# Patient Record
Sex: Female | Born: 1985 | Race: Black or African American | Hispanic: No | Marital: Single | Smoking: Current every day smoker
Health system: Southern US, Community
[De-identification: ages and names within clinical notes are randomized; demographics above are authoritative.]

## PROBLEM LIST (undated history)

## (undated) HISTORY — PX: TUBAL LIGATION: SHX77

---

## 2007-05-13 ENCOUNTER — Ambulatory Visit: Payer: Self-pay | Admitting: Obstetrics and Gynecology

## 2007-05-13 ENCOUNTER — Inpatient Hospital Stay (HOSPITAL_COMMUNITY): Admission: AD | Admit: 2007-05-13 | Discharge: 2007-05-13 | Payer: Self-pay | Admitting: Obstetrics and Gynecology

## 2007-06-06 ENCOUNTER — Ambulatory Visit: Payer: Self-pay | Admitting: Obstetrics & Gynecology

## 2007-06-13 ENCOUNTER — Ambulatory Visit: Payer: Self-pay | Admitting: Obstetrics & Gynecology

## 2007-06-15 ENCOUNTER — Inpatient Hospital Stay (HOSPITAL_COMMUNITY): Admission: AD | Admit: 2007-06-15 | Discharge: 2007-06-15 | Payer: Self-pay | Admitting: Obstetrics and Gynecology

## 2007-06-15 ENCOUNTER — Ambulatory Visit: Payer: Self-pay | Admitting: Physician Assistant

## 2007-06-20 ENCOUNTER — Ambulatory Visit: Payer: Self-pay | Admitting: Obstetrics & Gynecology

## 2007-06-20 ENCOUNTER — Ambulatory Visit: Payer: Self-pay | Admitting: Gynecology

## 2007-06-20 ENCOUNTER — Inpatient Hospital Stay (HOSPITAL_COMMUNITY): Admission: RE | Admit: 2007-06-20 | Discharge: 2007-06-24 | Payer: Self-pay | Admitting: Obstetrics & Gynecology

## 2007-06-21 ENCOUNTER — Other Ambulatory Visit: Payer: Self-pay | Admitting: Obstetrics & Gynecology

## 2008-03-09 IMAGING — US US OB COMP +14 WK
1 series · 14 of 28 positions shown · non-contrast
Comparison: none

OBSTETRICAL ULTRASOUND:

 This ultrasound exam was performed in the [HOSPITAL] Ultrasound Department.  The OB US report was generated in the AS system, and faxed to the ordering physician.  This report is also available in [REDACTED] PACS.

[Series 1: us ob comp +14 wk · 14 of 31 slices shown]
[im 2/31]
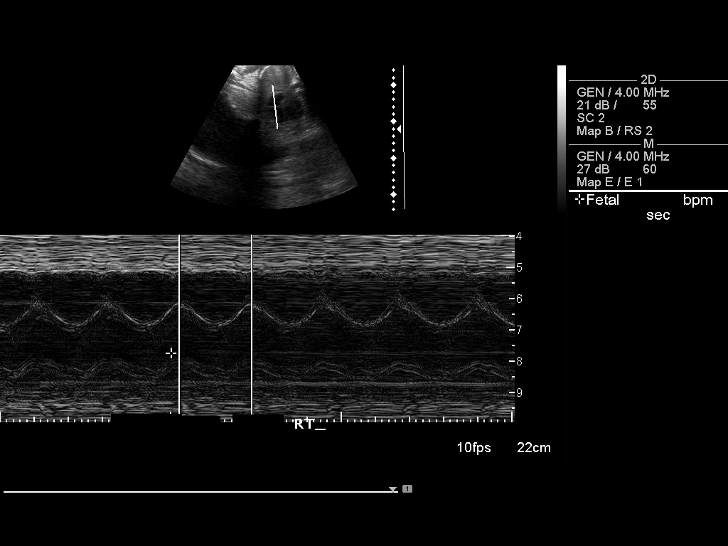
[im 4/31]
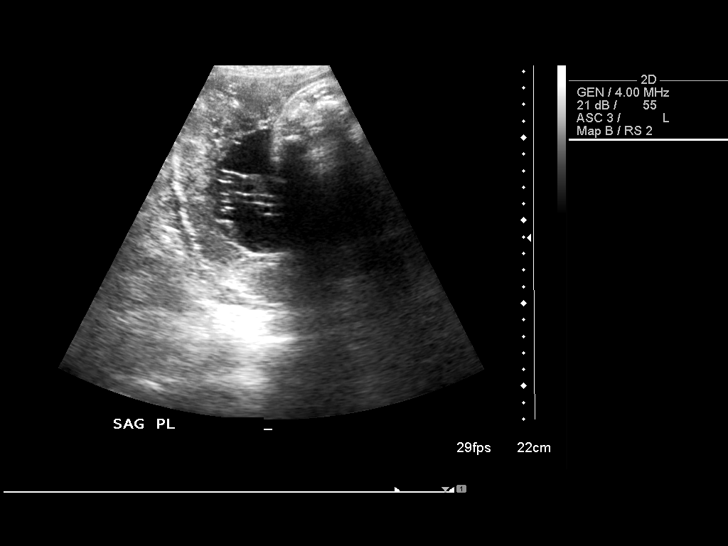
[im 6/31]
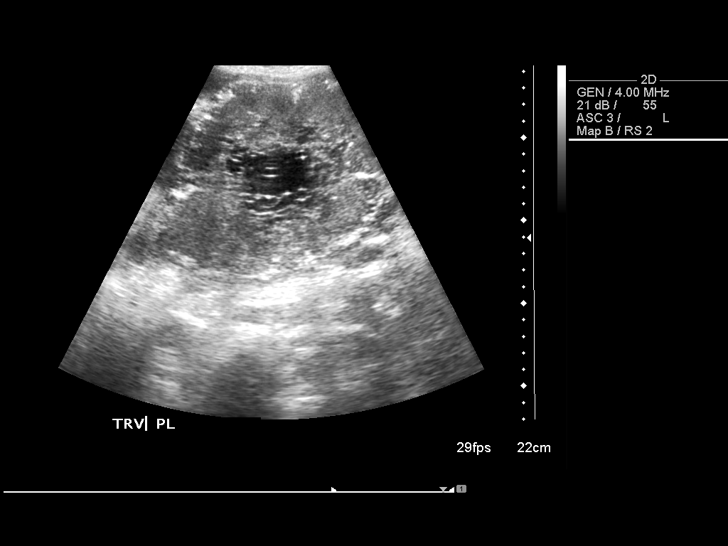
[im 8/31]
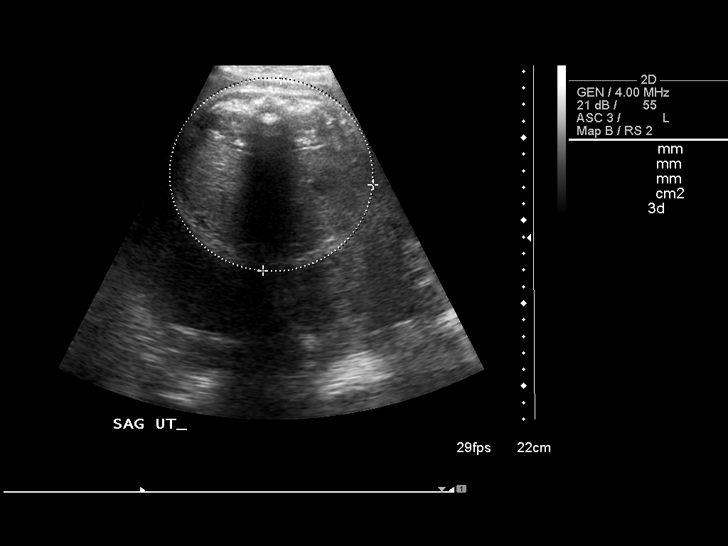
[im 11/31]
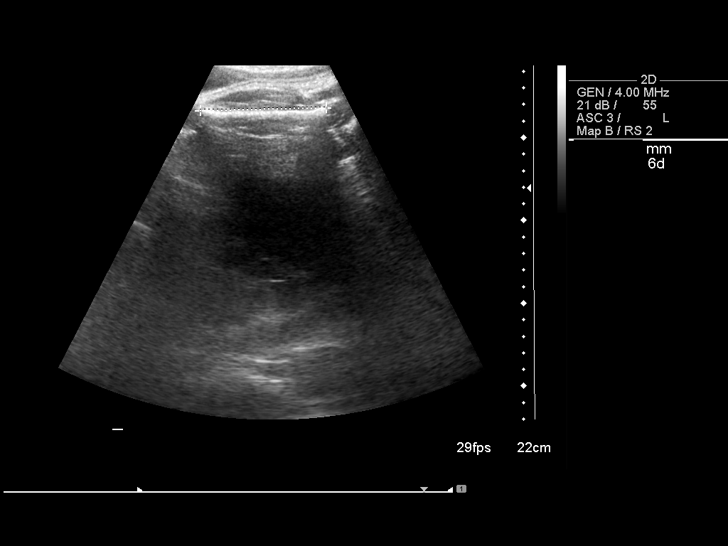
[im 13/31]
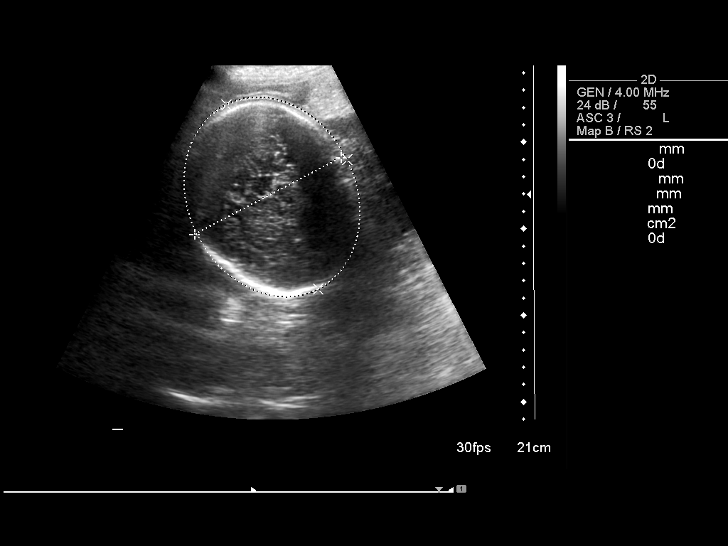
[im 15/31]
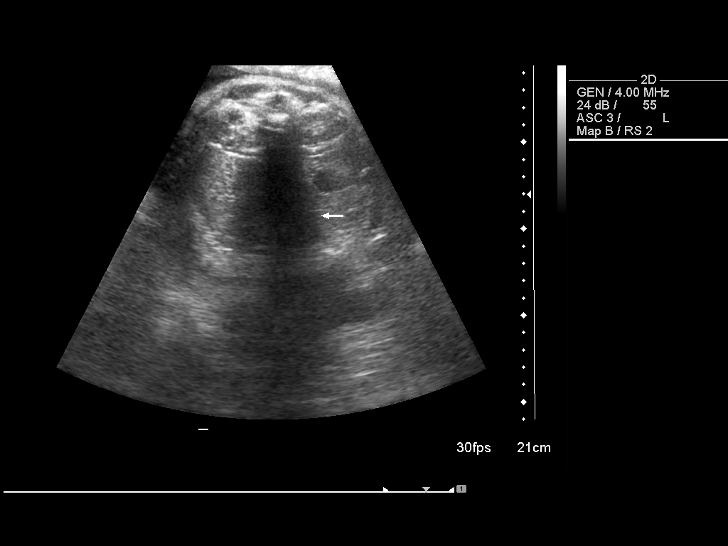
[im 17/31]
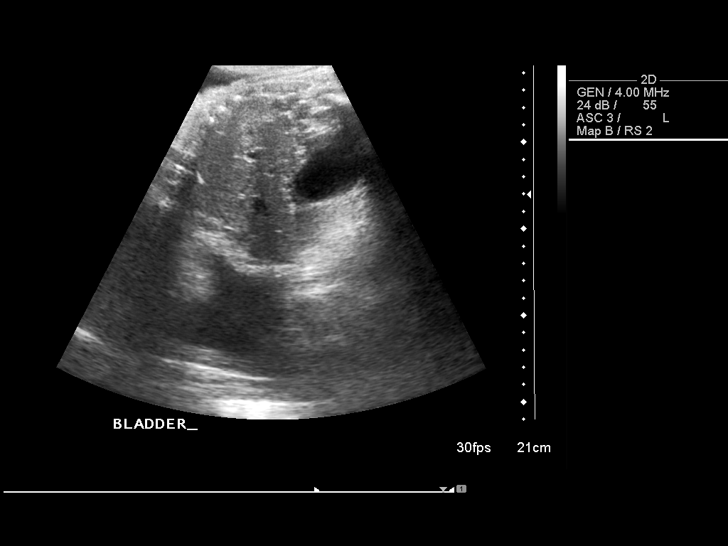
[im 19/31]
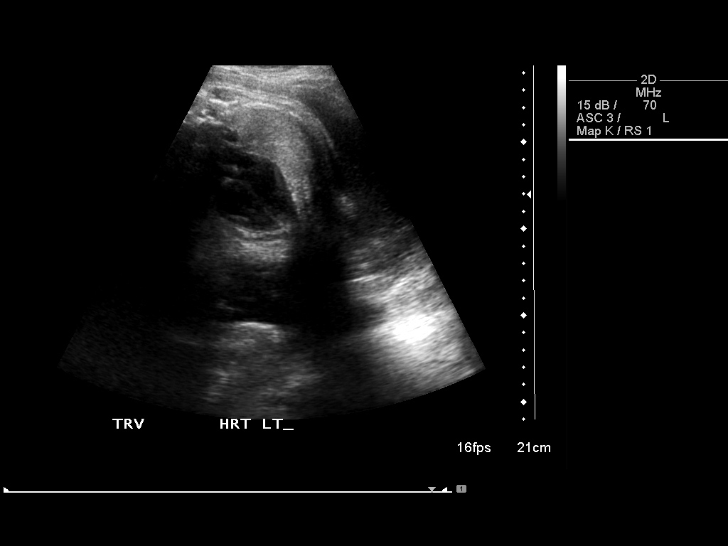
[im 22/31]
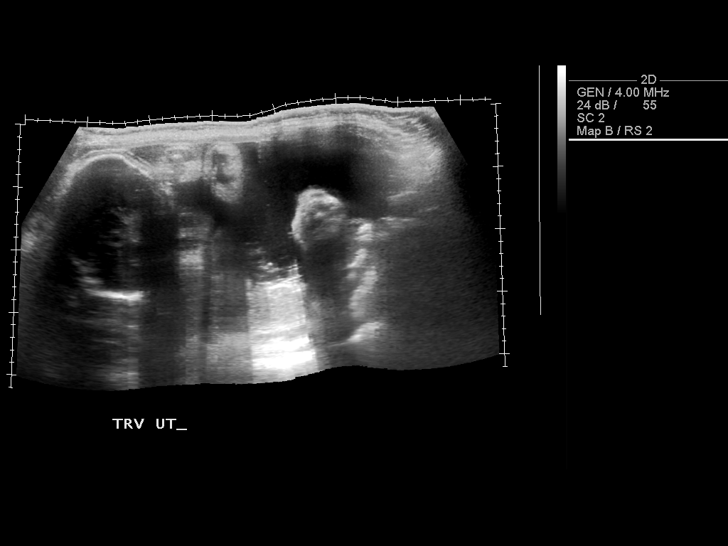
[im 24/31]
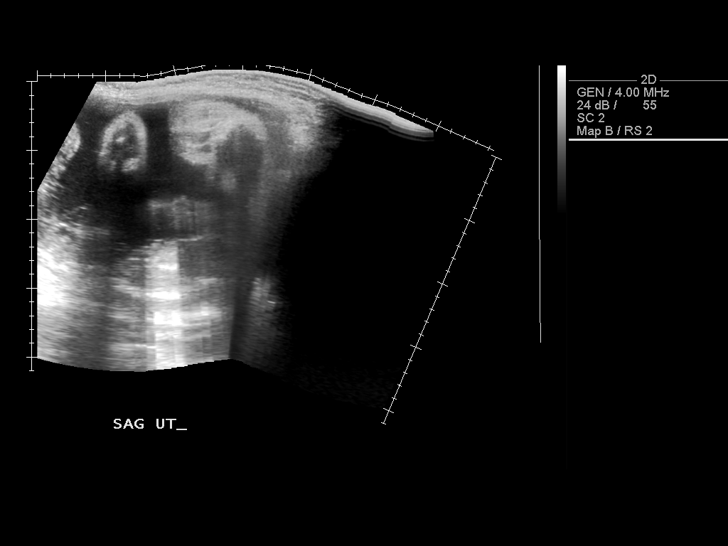
[im 26/31]
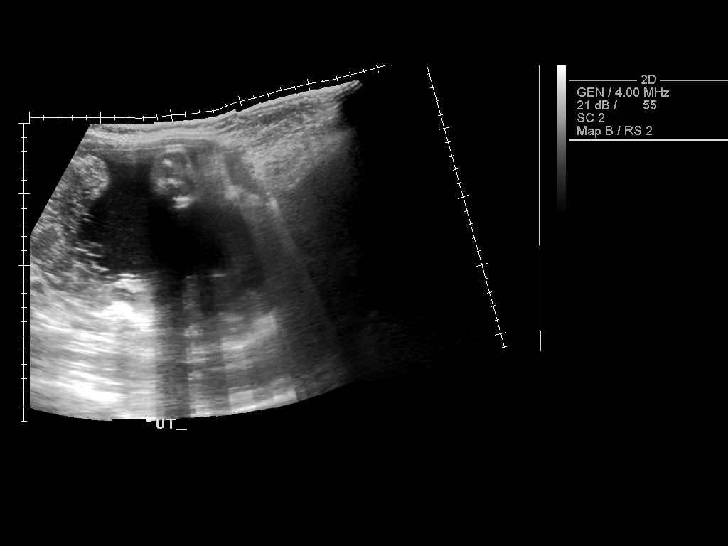
[im 28/31]
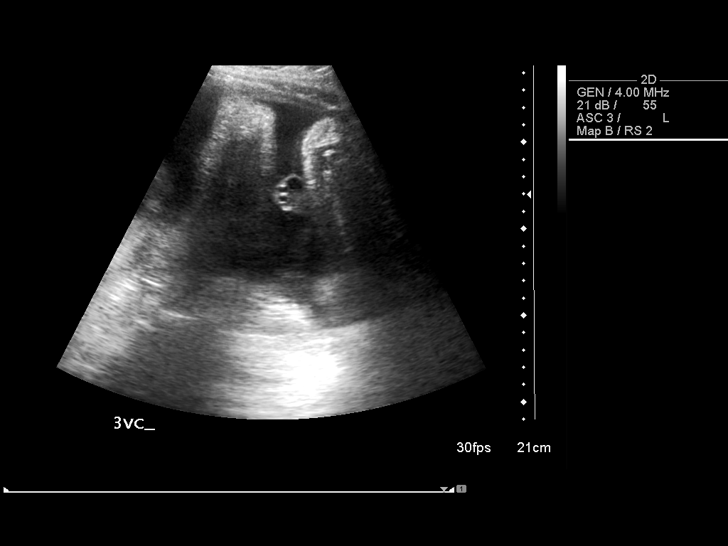
[im 31/31]
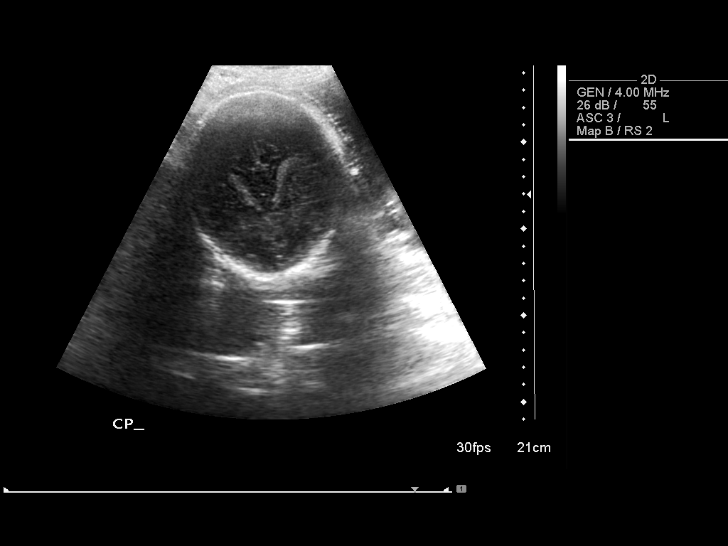

[14 of 28 positions shown; findings below may reference images not displayed]

IMPRESSION: See [facility] Obstetric US report.

## 2008-06-02 ENCOUNTER — Inpatient Hospital Stay (HOSPITAL_COMMUNITY): Admission: AD | Admit: 2008-06-02 | Discharge: 2008-06-02 | Payer: Self-pay | Admitting: Obstetrics

## 2008-06-09 ENCOUNTER — Inpatient Hospital Stay (HOSPITAL_COMMUNITY): Admission: AD | Admit: 2008-06-09 | Discharge: 2008-06-09 | Payer: Self-pay | Admitting: Obstetrics

## 2008-06-27 ENCOUNTER — Inpatient Hospital Stay (HOSPITAL_COMMUNITY): Admission: AD | Admit: 2008-06-27 | Discharge: 2008-06-27 | Payer: Self-pay | Admitting: Obstetrics

## 2008-07-05 ENCOUNTER — Inpatient Hospital Stay (HOSPITAL_COMMUNITY): Admission: AD | Admit: 2008-07-05 | Discharge: 2008-07-05 | Payer: Self-pay | Admitting: Obstetrics

## 2008-07-28 ENCOUNTER — Inpatient Hospital Stay (HOSPITAL_COMMUNITY): Admission: AD | Admit: 2008-07-28 | Discharge: 2008-07-28 | Payer: Self-pay | Admitting: Obstetrics

## 2008-08-08 ENCOUNTER — Inpatient Hospital Stay (HOSPITAL_COMMUNITY): Admission: AD | Admit: 2008-08-08 | Discharge: 2008-08-08 | Payer: Self-pay | Admitting: Obstetrics

## 2008-08-10 ENCOUNTER — Inpatient Hospital Stay (HOSPITAL_COMMUNITY): Admission: AD | Admit: 2008-08-10 | Discharge: 2008-08-10 | Payer: Self-pay | Admitting: Obstetrics

## 2008-08-12 ENCOUNTER — Inpatient Hospital Stay (HOSPITAL_COMMUNITY): Admission: AD | Admit: 2008-08-12 | Discharge: 2008-08-14 | Payer: Self-pay | Admitting: Obstetrics

## 2009-03-04 ENCOUNTER — Emergency Department (HOSPITAL_COMMUNITY): Admission: EM | Admit: 2009-03-04 | Discharge: 2009-03-04 | Payer: Self-pay | Admitting: Emergency Medicine

## 2009-03-22 ENCOUNTER — Ambulatory Visit: Payer: Self-pay | Admitting: Physician Assistant

## 2009-03-22 ENCOUNTER — Inpatient Hospital Stay (HOSPITAL_COMMUNITY): Admission: AD | Admit: 2009-03-22 | Discharge: 2009-03-22 | Payer: Self-pay | Admitting: Obstetrics and Gynecology

## 2009-04-30 IMAGING — US US FETAL BPP W/O NONSTRESS
1 series · 14 of 14 positions shown · non-contrast
Comparison: none

OBSTETRICAL ULTRASOUND:
 This ultrasound exam was performed in the [HOSPITAL] Ultrasound Department.  The OB US report was generated in the AS system, and faxed to the ordering physician.  This report is also available in [REDACTED] PACS.

[Series 1: us fetal bpp w/o nonstress · non-contrast · 14 acquisitions, 14 frames shown]
[im 1/14]
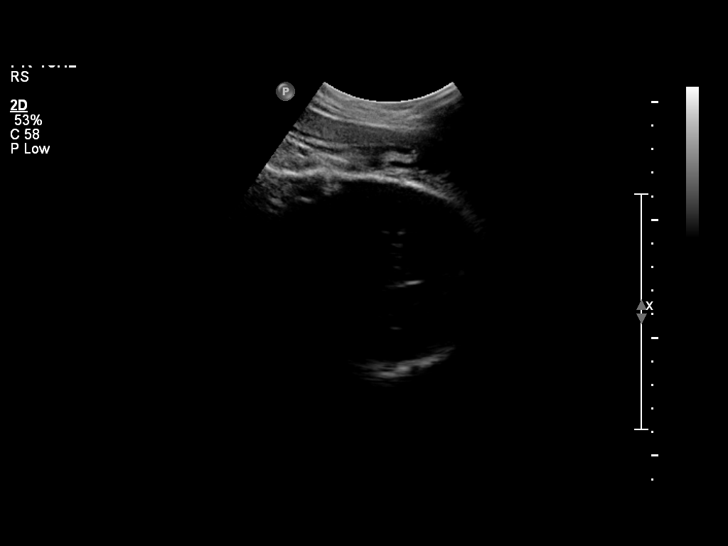
[im 2/14]
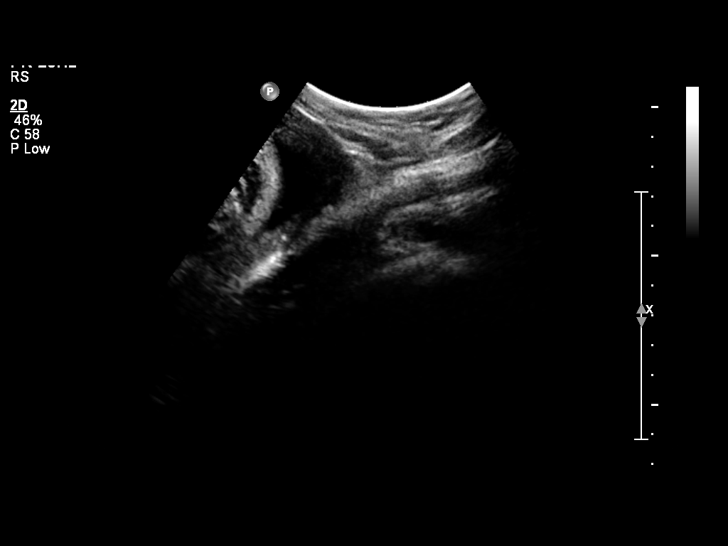
[im 3/14]
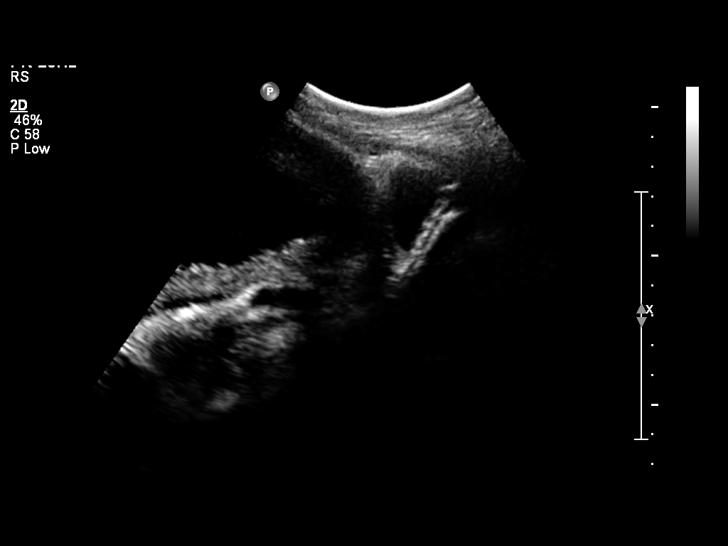
[im 4/14]
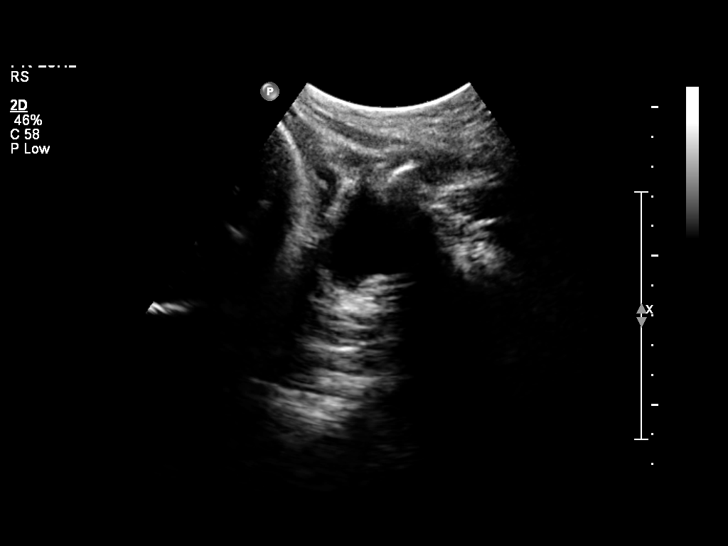
[im 5/14]
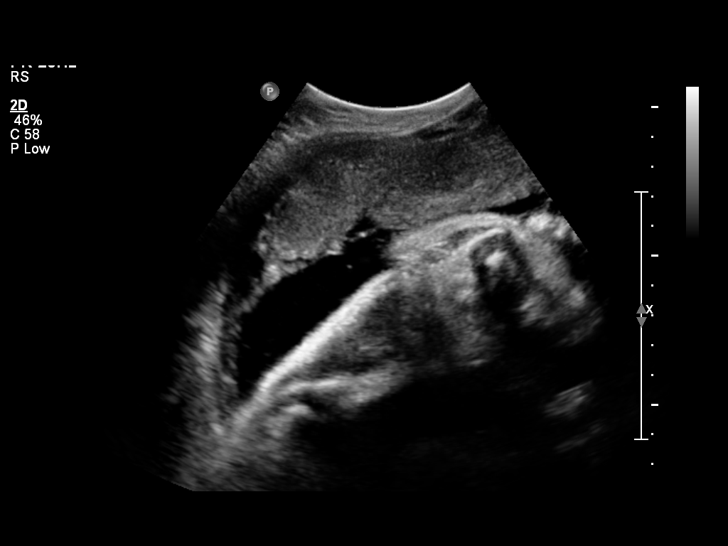
[im 6/14]
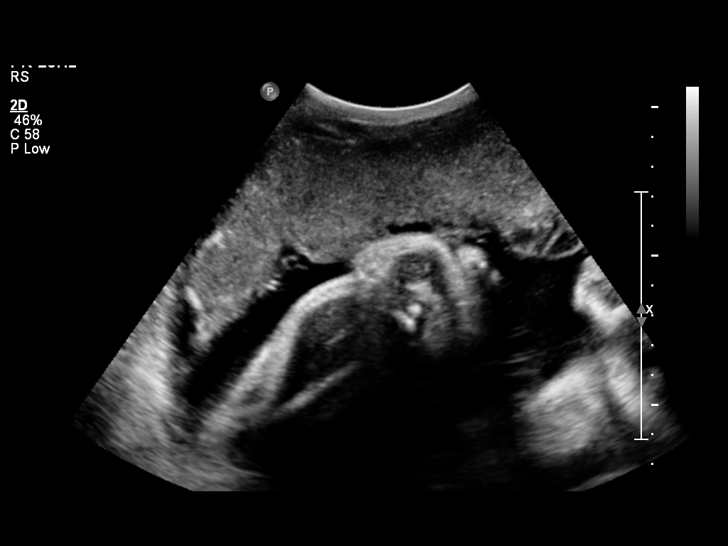
[im 7/14]
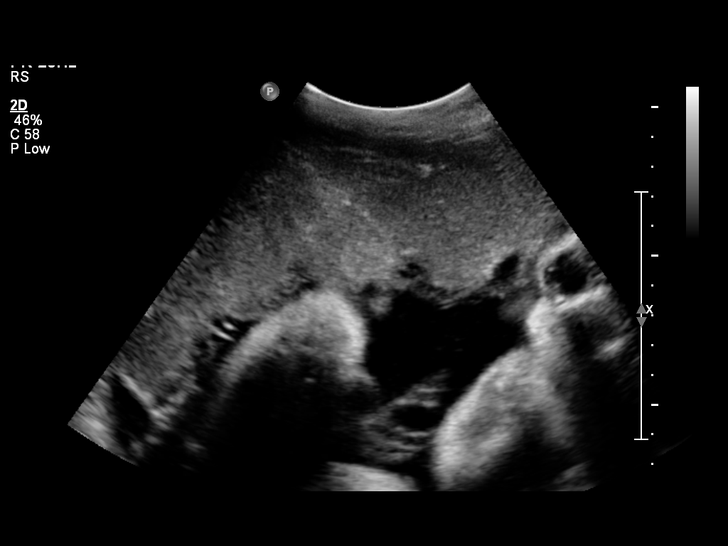
[im 8/14]
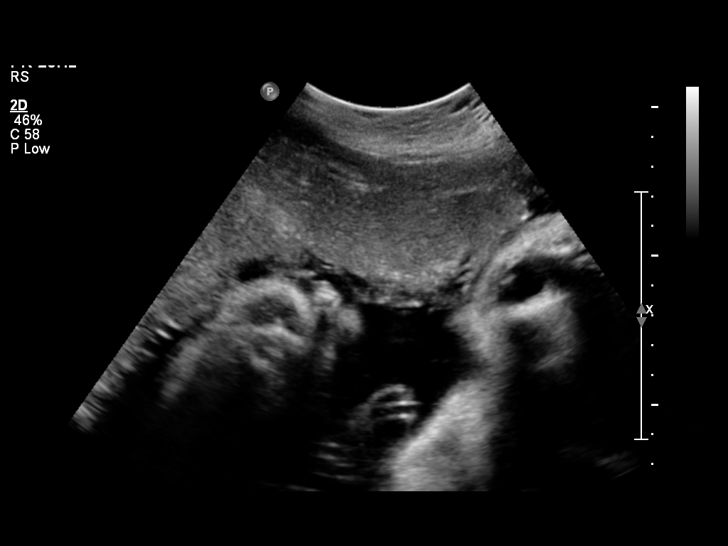
[im 9/14]
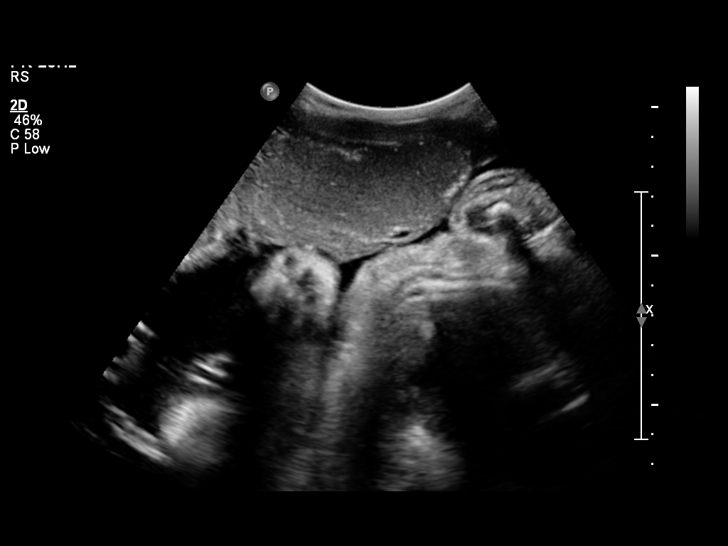
[im 10/14]
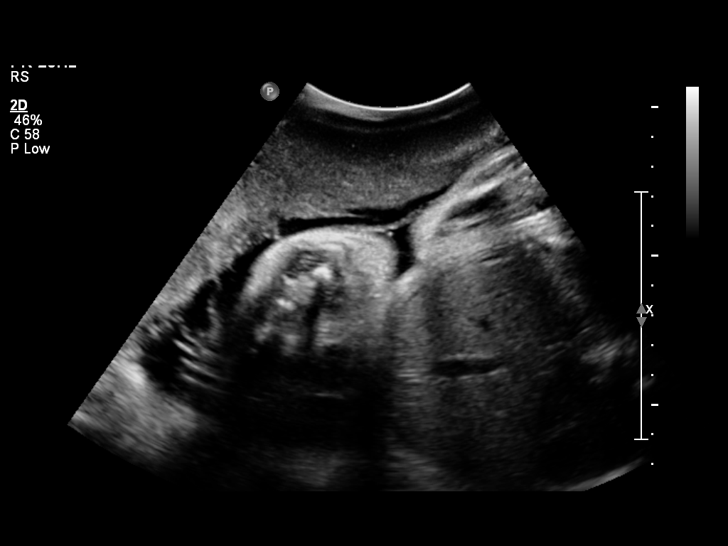
[im 11/14]
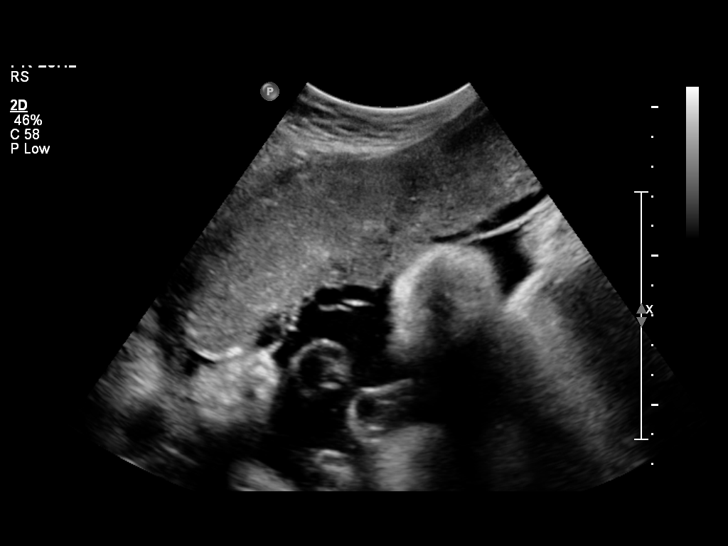
[im 12/14]
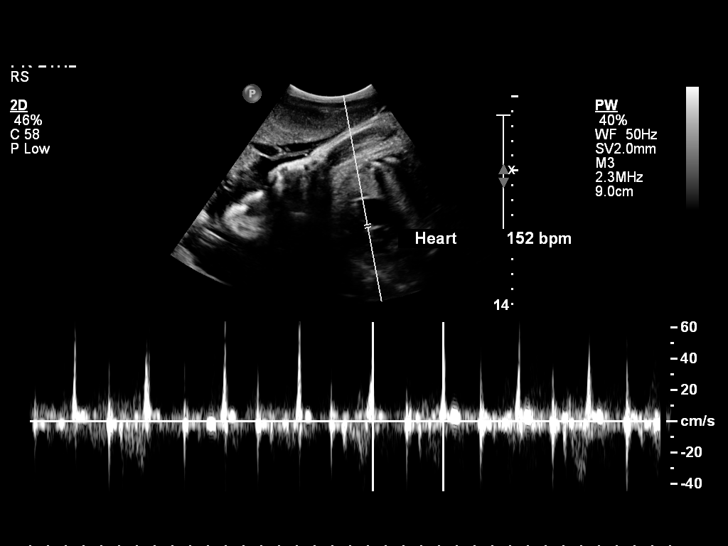
[im 13/14]
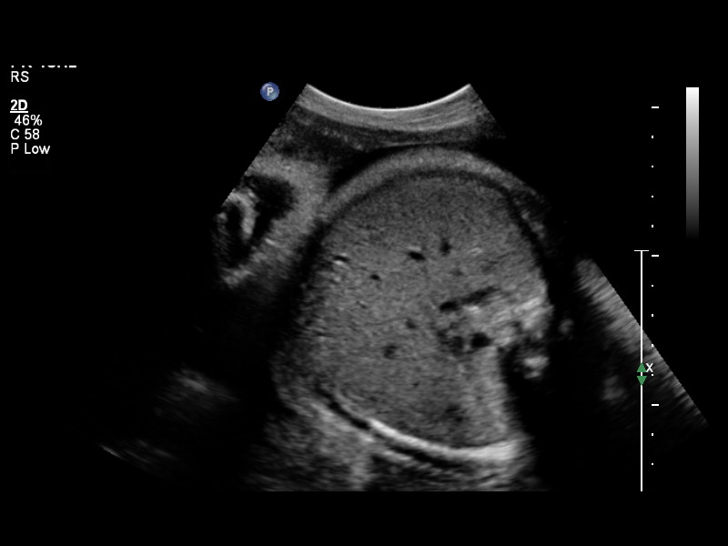
[im 14/14]
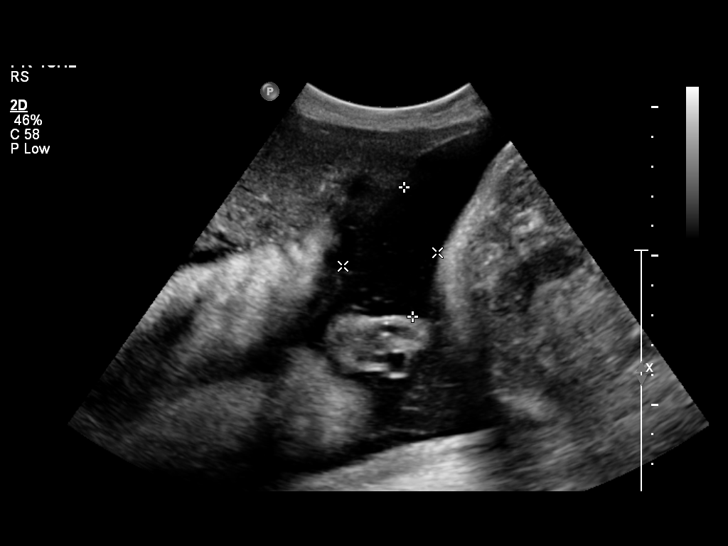

[14 of 14 positions shown; findings below may reference images not displayed]

IMPRESSION: See AS Obstetric US report.

## 2010-12-05 ENCOUNTER — Encounter: Payer: Self-pay | Admitting: *Deleted

## 2011-02-23 LAB — URINALYSIS, ROUTINE W REFLEX MICROSCOPIC
Bilirubin Urine: NEGATIVE
Glucose, UA: NEGATIVE mg/dL
Ketones, ur: NEGATIVE mg/dL
pH: 5.5 (ref 5.0–8.0)

## 2011-02-23 LAB — GC/CHLAMYDIA PROBE AMP, GENITAL
Chlamydia, DNA Probe: NEGATIVE
GC Probe Amp, Genital: NEGATIVE

## 2011-02-23 LAB — URINE MICROSCOPIC-ADD ON

## 2011-02-23 LAB — WET PREP, GENITAL

## 2011-08-12 LAB — URINALYSIS, ROUTINE W REFLEX MICROSCOPIC
Bilirubin Urine: NEGATIVE
Hgb urine dipstick: NEGATIVE
Ketones, ur: 15 — AB
Nitrite: NEGATIVE
Nitrite: NEGATIVE
Protein, ur: NEGATIVE
Protein, ur: NEGATIVE
Specific Gravity, Urine: 1.025
Specific Gravity, Urine: 1.02
Urobilinogen, UA: 1
Urobilinogen, UA: 0.2
pH: 6

## 2011-08-12 LAB — WET PREP, GENITAL
Clue Cells Wet Prep HPF POC: NONE SEEN
Clue Cells Wet Prep HPF POC: NONE SEEN
Trich, Wet Prep: NONE SEEN
Trich, Wet Prep: NONE SEEN
Yeast Wet Prep HPF POC: NONE SEEN

## 2011-08-12 LAB — GC/CHLAMYDIA PROBE AMP, GENITAL: GC Probe Amp, Genital: POSITIVE — AB

## 2011-08-15 LAB — CBC
HCT: 27.1 — ABNORMAL LOW
Hemoglobin: 9 — ABNORMAL LOW
Platelets: 250
Platelets: 184
RBC: 4.11
RDW: 14.8
WBC: 12.6 — ABNORMAL HIGH
WBC: 12.9 — ABNORMAL HIGH

## 2011-08-15 LAB — RPR: RPR Ser Ql: NONREACTIVE

## 2011-08-29 LAB — POCT URINALYSIS DIP (DEVICE)
Bilirubin Urine: NEGATIVE
Glucose, UA: NEGATIVE
Glucose, UA: NEGATIVE
Glucose, UA: NEGATIVE
Ketones, ur: NEGATIVE
Nitrite: NEGATIVE
Nitrite: NEGATIVE
Nitrite: NEGATIVE
Operator id: 134861
Operator id: 14902
Urobilinogen, UA: 1
Urobilinogen, UA: 1
pH: 6

## 2011-08-29 LAB — FLM(FETAL LUNG MATURITY), AMNIOTIC FLUID: Gestational Age: 37

## 2011-08-29 LAB — CBC
HCT: 32.6 — ABNORMAL LOW
Hemoglobin: 10.9 — ABNORMAL LOW
Hemoglobin: 9.9 — ABNORMAL LOW
MCHC: 33.3
MCHC: 33.4
Platelets: 230
Platelets: 266
RDW: 15.8 — ABNORMAL HIGH
RDW: 16.1 — ABNORMAL HIGH

## 2011-08-29 LAB — TYPE AND SCREEN: ABO/RH(D): O POS

## 2011-08-29 LAB — LSPG (L/S RATIO WITH PG)-AMNIO FLUID

## 2011-08-29 LAB — ABO/RH: ABO/RH(D): O POS

## 2011-08-31 LAB — URINALYSIS, ROUTINE W REFLEX MICROSCOPIC
Bilirubin Urine: NEGATIVE
Ketones, ur: NEGATIVE
Nitrite: NEGATIVE
Specific Gravity, Urine: 1.015
Urobilinogen, UA: 0.2
pH: 6.5

## 2011-08-31 LAB — CBC
HCT: 31.4 — ABNORMAL LOW
MCHC: 33.2
MCV: 74.7 — ABNORMAL LOW
RBC: 4.2
WBC: 13.9 — ABNORMAL HIGH

## 2011-08-31 LAB — HERPES SIMPLEX VIRUS CULTURE: Culture: NOT DETECTED

## 2011-08-31 LAB — DIFFERENTIAL
Basophils Relative: 0
Eosinophils Absolute: 0.1
Eosinophils Relative: 1
Lymphs Abs: 2.5
Monocytes Relative: 4

## 2011-08-31 LAB — URINE MICROSCOPIC-ADD ON

## 2011-08-31 LAB — WET PREP, GENITAL

## 2011-08-31 LAB — GC/CHLAMYDIA PROBE AMP, GENITAL: Chlamydia, DNA Probe: NEGATIVE

## 2011-08-31 LAB — SICKLE CELL SCREEN: Sickle Cell Screen: NEGATIVE

## 2019-10-18 ENCOUNTER — Encounter (HOSPITAL_BASED_OUTPATIENT_CLINIC_OR_DEPARTMENT_OTHER): Payer: Self-pay | Admitting: *Deleted

## 2019-10-18 ENCOUNTER — Emergency Department (HOSPITAL_BASED_OUTPATIENT_CLINIC_OR_DEPARTMENT_OTHER)
Admission: EM | Admit: 2019-10-18 | Discharge: 2019-10-18 | Disposition: A | Discharge status: Home / Self Care | Payer: Medicaid Other | Attending: Emergency Medicine | Admitting: Emergency Medicine

## 2019-10-18 ENCOUNTER — Other Ambulatory Visit: Payer: Self-pay

## 2019-10-18 ENCOUNTER — Emergency Department (HOSPITAL_BASED_OUTPATIENT_CLINIC_OR_DEPARTMENT_OTHER): Payer: Medicaid Other

## 2019-10-18 DIAGNOSIS — Z5321 Procedure and treatment not carried out due to patient leaving prior to being seen by health care provider: Secondary | ICD-10-CM | POA: Diagnosis not present

## 2019-10-18 DIAGNOSIS — M79672 Pain in left foot: Secondary | ICD-10-CM | POA: Diagnosis not present
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
